# Patient Record
Sex: Female | Born: 2000 | Race: White | Hispanic: No | Marital: Single
Health system: Southern US, Community
[De-identification: ages and names within clinical notes are randomized; demographics above are authoritative.]

## PROBLEM LIST (undated history)

## (undated) DIAGNOSIS — L0390 Cellulitis, unspecified: Secondary | ICD-10-CM

## (undated) DIAGNOSIS — E663 Overweight: Secondary | ICD-10-CM

## (undated) DIAGNOSIS — Z77018 Contact with and (suspected) exposure to other hazardous metals: Secondary | ICD-10-CM

## (undated) DIAGNOSIS — L709 Acne, unspecified: Secondary | ICD-10-CM

## (undated) DIAGNOSIS — Z68.41 Body mass index (BMI) pediatric, 85th percentile to less than 95th percentile for age: Secondary | ICD-10-CM

## (undated) HISTORY — DX: Body mass index (bmi) pediatric, 85th percentile to less than 95th percentile for age: Z68.53

## (undated) HISTORY — DX: Cellulitis, unspecified: L03.90

## (undated) HISTORY — DX: Overweight: E66.3

## (undated) HISTORY — DX: Contact with and (suspected) exposure to other hazardous metals: Z77.018

## (undated) HISTORY — DX: Acne, unspecified: L70.9

---
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
## 2018-11-25 ENCOUNTER — Emergency Department (HOSPITAL_COMMUNITY)
Admission: EM | Admit: 2018-11-25 | Discharge: 2018-11-26 | Disposition: A | Discharge status: Home / Self Care | Payer: Self-pay | Attending: Emergency Medicine | Admitting: Emergency Medicine

## 2018-11-25 ENCOUNTER — Encounter: Payer: Self-pay | Admitting: Emergency Medicine

## 2018-11-25 DIAGNOSIS — Z5321 Procedure and treatment not carried out due to patient leaving prior to being seen by health care provider: Secondary | ICD-10-CM | POA: Insufficient documentation

## 2018-11-25 LAB — CBC
HCT: 41.6 % (ref 36.0–46.0)
Hemoglobin: 14.3 g/dL (ref 12.0–15.0)
MCH: 32.1 pg (ref 26.0–34.0)
MCHC: 34.4 g/dL (ref 30.0–36.0)
MCV: 93.5 fL (ref 80.0–100.0)
PLATELETS: 321 10*3/uL (ref 150–400)
RBC: 4.45 MIL/uL (ref 3.87–5.11)
RDW: 11.4 % — ABNORMAL LOW (ref 11.5–15.5)
WBC: 9 10*3/uL (ref 4.0–10.5)
nRBC: 0 % (ref 0.0–0.2)

## 2018-11-25 NOTE — ED Triage Notes (Signed)
Pt reports vaginal bleeding that started yesterday. Started out as light bleeding and now it is heavy, states she passed 2 large clots. Pt states she is pregnant but does not know how far along. Abd cramping

## 2018-11-26 ENCOUNTER — Ambulatory Visit (HOSPITAL_COMMUNITY)
Admission: EM | Admit: 2018-11-26 | Discharge: 2018-11-26 | Disposition: A | Discharge status: Home / Self Care | Payer: Self-pay | Attending: Internal Medicine | Admitting: Internal Medicine

## 2018-11-26 ENCOUNTER — Encounter (HOSPITAL_COMMUNITY): Payer: Self-pay | Admitting: Emergency Medicine

## 2018-11-26 DIAGNOSIS — O039 Complete or unspecified spontaneous abortion without complication: Secondary | ICD-10-CM | POA: Insufficient documentation

## 2018-11-26 LAB — HCG, QUANTITATIVE, PREGNANCY: HCG, BETA CHAIN, QUANT, S: 24 m[IU]/mL — AB (ref <5)

## 2018-11-26 LAB — COMPREHENSIVE METABOLIC PANEL
ALT: 14 U/L (ref 0–44)
ANION GAP: 9 (ref 5–15)
AST: 18 U/L (ref 15–41)
Albumin: 3.8 g/dL (ref 3.5–5.0)
Alkaline Phosphatase: 50 U/L (ref 38–126)
BUN: 7 mg/dL (ref 6–20)
CO2: 24 mmol/L (ref 22–32)
Calcium: 8.8 mg/dL — ABNORMAL LOW (ref 8.9–10.3)
Chloride: 108 mmol/L (ref 98–111)
Creatinine, Ser: 0.69 mg/dL (ref 0.44–1.00)
Glucose, Bld: 107 mg/dL — ABNORMAL HIGH (ref 70–99)
POTASSIUM: 3.5 mmol/L (ref 3.5–5.1)
SODIUM: 141 mmol/L (ref 135–145)
Total Bilirubin: 0.4 mg/dL (ref 0.3–1.2)
Total Protein: 6.6 g/dL (ref 6.5–8.1)

## 2018-11-26 LAB — LIPASE, BLOOD: LIPASE: 23 U/L (ref 11–51)

## 2018-11-26 NOTE — ED Provider Notes (Signed)
[facility]    CSN: [id number] Arrival date & time: 11/26/18  [id number]     History   Chief Complaint No chief complaint on file.   HPI Katelyn Rodriguez is a 18 y.o. female.   18 yo female with no chronic medical problems presents to urgent care complaining of vaginal bleeding x3 days. The patient reports that she took "probably 5" pregnancy tests 2 days ago that were positive.  Her LMP began 10/13/18.  She reports that her periods last 4-10 days.  Seen in the ED last night for the same but left AMA before discussing results from bloodwork. Admits to some cramping pain associated with bleeding.      Past Medical History:  Diagnosis Date  . Acne 2012  . Cellulitis 2001   left foot cellulitis, admitted  . Exposure to mercury 2003   got mercury in foot from broken thermometer  . Overweight peds (BMI 85-94.9 percentile) 2012    Patient Active Problem List   Diagnosis Date Noted  . 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 Idiopathic scoliosis 08/25/2013  . Acne 08/25/2013    Past Surgical History:  Procedure Laterality Date  . OPEN REDUCTION INTERNAL FIXATION (ORIF) DISTAL PHALANX Right 03/2007   2nd IP joint toes    OB History    Gravida  1   Para      Term      Preterm      AB      Living        SAB      TAB      Ectopic      Multiple      Live Births               Home Medications    Prior to Admission medications   Medication Sig Start Date End Date Taking? Authorizing Provider  cetirizine (ZYRTEC ALLERGY) 10 MG tablet Take 1 tablet (10 mg total) by mouth daily. 02/01/15   Presson, Mathis FareJennifer Lee H, PA  ibuprofen (ADVIL,MOTRIN) 600 MG tablet Take 1 tablet (600 mg total) by mouth every 6 (six) hours as needed. Patient not taking: Reported on 08/24/2015 05/08/15   Elpidio AnisUpstill, Shari, PA-C    Family History Family History  Problem Relation Age of Onset  . ADD / ADHD Sister     Social History Social History    Tobacco Use  . Smoking status: Passive Smoke Exposure - Never Smoker  . Smokeless tobacco: Never Used  . Tobacco comment: Mom and dad smoke both inside and outside. mom one pack a day  Substance Use Topics  . Alcohol use: No  . Drug use: No     Allergies   Patient has no known allergies.   Review of Systems Review of Systems  Constitutional: Negative for chills and fever.  HENT: Negative for sore throat and tinnitus.   Eyes: Negative for redness.  Respiratory: Negative for cough and shortness of breath.   Cardiovascular: Negative for chest pain and palpitations.  Gastrointestinal: Negative for abdominal pain, diarrhea, nausea and vomiting.  Genitourinary: Negative for dysuria, frequency and urgency.  Musculoskeletal: Negative for myalgias.  Skin: Negative for rash.       No lesions  Neurological: Negative for weakness.  Hematological: Does not bruise/bleed easily.  Psychiatric/Behavioral: Negative for suicidal ideas.     Physical Exam Triage Vital Signs ED Triage Vitals [11/26/18 1109]  Enc Vitals Group     BP 113/85  Pulse Rate 80     Resp 16     Temp 98.3 F (36.8 C)     Temp Source Oral     SpO2 100 %     Weight      Height      Head Circumference      Peak Flow      Pain Score 0     Pain Loc      Pain Edu?      Excl. in GC?    No data found.  Updated Vital Signs BP 113/85 (BP Location: Right Arm)   Pulse 80   Temp 98.3 F (36.8 C) (Oral)   Resp 16   LMP 11/26/2018   SpO2 100%   Visual Acuity Right Eye Distance:   Left Eye Distance:   Bilateral Distance:    Right Eye Near:   Left Eye Near:    Bilateral Near:     Physical Exam Vitals signs and nursing note reviewed.  Constitutional:      General: She is not in acute distress.    Appearance: She is well-developed.  HENT:     Head: Normocephalic and atraumatic.  Eyes:     General: No scleral icterus.    Conjunctiva/sclera: Conjunctivae normal.     Pupils: Pupils are equal,  round, and reactive to light.  Neck:     Musculoskeletal: Normal range of motion and neck supple.     Thyroid: No thyromegaly.     Vascular: No JVD.     Trachea: No tracheal deviation.  Cardiovascular:     Rate and Rhythm: Normal rate and regular rhythm.     Heart sounds: Normal heart sounds. No murmur. No friction rub. No gallop.   Pulmonary:     Effort: Pulmonary effort is normal.     Breath sounds: Normal breath sounds.  Abdominal:     General: Bowel sounds are normal. There is no distension.     Palpations: Abdomen is soft.     Tenderness: There is no abdominal tenderness.  Musculoskeletal: Normal range of motion.  Lymphadenopathy:     Cervical: No cervical adenopathy.  Skin:    General: Skin is warm and dry.  Neurological:     Mental Status: She is alert and oriented to person, place, and time.     Cranial Nerves: No cranial nerve deficit.  Psychiatric:        Behavior: Behavior normal.        Thought Content: Thought content normal.        Judgment: Judgment normal.      UC Treatments / Results  Labs (all labs ordered are listed, but only abnormal results are displayed) Labs Reviewed - No data to display  EKG None  Radiology No results found.  Procedures Procedures (including critical care time)  Medications Ordered in UC Medications - No data to display  Initial Impression / Assessment and Plan / UC Course  I have reviewed the triage vital signs and the nursing notes.  Pertinent labs & imaging results that were available during my care of the patient were reviewed by me and considered in my medical decision making (see chart for details).     hCG 24 in ED last night low-normal for approx [redacted] week gestation (assuming end of cycle 11/27 with likely ovulation 12/10-12/12). Likey miscarriage given clots of blood.  Advised repeating urine pregnancy in 5-7 days.  If still positive needs follow up for vaginal ultrasound.    Final  Clinical Impressions(s) / UC  Diagnoses   Final diagnoses:  Miscarriage   Discharge Instructions   None    ED Prescriptions    None     Controlled Substance Prescriptions [place] Controlled Substance Registry consulted? Not Applicable   Arnaldo Natal, MD 11/26/18 1135

## 2018-11-26 NOTE — ED Triage Notes (Addendum)
Pt presents to [facility] for assessment after being seen in the ED last night but leaving AMA.  States she has been having vaginal bleeding for 3 days, and yesterday she began passing large clots.  Patient states recent positive pregnancy test.  LMP prior was early November.  Pt c/o "period cramp" type pain.  Slightly positive hcg listed in chart for yesterday.

## 2018-11-26 NOTE — ED Notes (Signed)
Pt notified registration staff at 0243 that she was leaving.

## 2021-11-17 DIAGNOSIS — N9489 Other specified conditions associated with female genital organs and menstrual cycle: Secondary | ICD-10-CM | POA: Diagnosis not present

## 2021-11-17 DIAGNOSIS — R109 Unspecified abdominal pain: Secondary | ICD-10-CM | POA: Diagnosis present

## 2021-11-17 DIAGNOSIS — Z5321 Procedure and treatment not carried out due to patient leaving prior to being seen by health care provider: Secondary | ICD-10-CM | POA: Insufficient documentation

## 2021-11-18 ENCOUNTER — Emergency Department (HOSPITAL_COMMUNITY)
Admission: EM | Admit: 2021-11-18 | Discharge: 2021-11-18 | Disposition: A | Discharge status: Home / Self Care | Attending: Student | Admitting: Student

## 2021-11-18 ENCOUNTER — Other Ambulatory Visit: Payer: Self-pay

## 2021-11-18 ENCOUNTER — Encounter: Payer: Self-pay | Admitting: Emergency Medicine

## 2021-11-18 DIAGNOSIS — N939 Abnormal uterine and vaginal bleeding, unspecified: Secondary | ICD-10-CM | POA: Insufficient documentation

## 2021-11-18 DIAGNOSIS — N9489 Other specified conditions associated with female genital organs and menstrual cycle: Secondary | ICD-10-CM | POA: Insufficient documentation

## 2021-11-18 DIAGNOSIS — R1031 Right lower quadrant pain: Secondary | ICD-10-CM | POA: Insufficient documentation

## 2021-11-18 DIAGNOSIS — Z2913 Encounter for prophylactic Rho(D) immune globulin: Secondary | ICD-10-CM | POA: Diagnosis not present

## 2021-11-18 DIAGNOSIS — Z7722 Contact with and (suspected) exposure to environmental tobacco smoke (acute) (chronic): Secondary | ICD-10-CM | POA: Diagnosis not present

## 2021-11-18 LAB — COMPREHENSIVE METABOLIC PANEL
ALT: 19 U/L (ref 0–44)
AST: 17 U/L (ref 15–41)
Albumin: 3.6 g/dL (ref 3.5–5.0)
Alkaline Phosphatase: 56 U/L (ref 38–126)
Anion gap: 8 (ref 5–15)
BUN: 8 mg/dL (ref 6–20)
CO2: 24 mmol/L (ref 22–32)
Calcium: 9.2 mg/dL (ref 8.9–10.3)
Chloride: 104 mmol/L (ref 98–111)
Creatinine, Ser: 0.75 mg/dL (ref 0.44–1.00)
GFR, Estimated: >60 mL/min (ref >60)
Glucose, Bld: 115 mg/dL — ABNORMAL HIGH (ref 70–99)
Potassium: 3.7 mmol/L (ref 3.5–5.1)
Sodium: 136 mmol/L (ref 135–145)
Total Bilirubin: 0.4 mg/dL (ref 0.3–1.2)
Total Protein: 6.4 g/dL — ABNORMAL LOW (ref 6.5–8.1)

## 2021-11-18 LAB — CBC
HCT: 42.2 % (ref 36.0–46.0)
Hemoglobin: 14.3 g/dL (ref 12.0–15.0)
MCH: 31.9 pg (ref 26.0–34.0)
MCHC: 33.9 g/dL (ref 30.0–36.0)
MCV: 94.2 fL (ref 80.0–100.0)
Platelets: 320 10*3/uL (ref 150–400)
RBC: 4.48 MIL/uL (ref 3.87–5.11)
RDW: 11.9 % (ref 11.5–15.5)
WBC: 14.2 10*3/uL — ABNORMAL HIGH (ref 4.0–10.5)
nRBC: 0 % (ref 0.0–0.2)

## 2021-11-18 LAB — I-STAT BETA HCG BLOOD, ED (MC, WL, AP ONLY): I-stat hCG, quantitative: 595.4 m[IU]/mL — ABNORMAL HIGH (ref <5)

## 2021-11-18 LAB — LIPASE, BLOOD: Lipase: 24 U/L (ref 11–51)

## 2021-11-18 NOTE — ED Triage Notes (Signed)
Pt c/o RLQ with nausea x2 days. Pt denies V/D.

## 2021-11-18 NOTE — ED Triage Notes (Signed)
Pt c/o right side flank pain that started earlier today.

## 2021-11-18 NOTE — ED Notes (Signed)
Pt upset with wait time, stating she was leaving AMA

## 2021-11-19 ENCOUNTER — Emergency Department
Admission: EM | Admit: 2021-11-19 | Discharge: 2021-11-19 | Disposition: A | Discharge status: Home / Self Care | Attending: Student in an Organized Health Care Education/Training Program | Admitting: Student in an Organized Health Care Education/Training Program

## 2021-11-19 ENCOUNTER — Emergency Department

## 2021-11-19 DIAGNOSIS — R1031 Right lower quadrant pain: Secondary | ICD-10-CM

## 2021-11-19 DIAGNOSIS — O009 Unspecified ectopic pregnancy without intrauterine pregnancy: Secondary | ICD-10-CM

## 2021-11-19 DIAGNOSIS — O00101 Right tubal pregnancy without intrauterine pregnancy: Secondary | ICD-10-CM

## 2021-11-19 LAB — COMPREHENSIVE METABOLIC PANEL
ALT: 24 U/L (ref 0–44)
AST: 20 U/L (ref 15–41)
Albumin: 4.3 g/dL (ref 3.5–5.0)
Alkaline Phosphatase: 59 U/L (ref 38–126)
Anion gap: 6 (ref 5–15)
BUN: 11 mg/dL (ref 6–20)
CO2: 25 mmol/L (ref 22–32)
Calcium: 9.2 mg/dL (ref 8.9–10.3)
Chloride: 104 mmol/L (ref 98–111)
Creatinine, Ser: 0.71 mg/dL (ref 0.44–1.00)
GFR, Estimated: >60 mL/min (ref >60)
Glucose, Bld: 118 mg/dL — ABNORMAL HIGH (ref 70–99)
Potassium: 3.5 mmol/L (ref 3.5–5.1)
Sodium: 135 mmol/L (ref 135–145)
Total Bilirubin: 0.5 mg/dL (ref 0.3–1.2)
Total Protein: 7.3 g/dL (ref 6.5–8.1)

## 2021-11-19 LAB — CBC
HCT: 40.5 % (ref 36.0–46.0)
Hemoglobin: 14.1 g/dL (ref 12.0–15.0)
MCH: 32 pg (ref 26.0–34.0)
MCHC: 34.8 g/dL (ref 30.0–36.0)
MCV: 92 fL (ref 80.0–100.0)
Platelets: 322 10*3/uL (ref 150–400)
RBC: 4.4 MIL/uL (ref 3.87–5.11)
RDW: 12 % (ref 11.5–15.5)
WBC: 15.9 10*3/uL — ABNORMAL HIGH (ref 4.0–10.5)
nRBC: 0 % (ref 0.0–0.2)

## 2021-11-19 LAB — ANTIBODY SCREEN: Antibody Screen: NEGATIVE

## 2021-11-19 LAB — URINALYSIS, ROUTINE W REFLEX MICROSCOPIC
Bacteria, UA: NONE SEEN
Bilirubin Urine: NEGATIVE
Glucose, UA: NEGATIVE mg/dL
Ketones, ur: NEGATIVE mg/dL
Leukocytes,Ua: NEGATIVE
Nitrite: NEGATIVE
Protein, ur: NEGATIVE mg/dL
Specific Gravity, Urine: 1.013 (ref 1.005–1.030)
pH: 6 (ref 5.0–8.0)

## 2021-11-19 LAB — POC URINE PREG, ED: Preg Test, Ur: POSITIVE — AB

## 2021-11-19 LAB — HCG, QUANTITATIVE, PREGNANCY: hCG, Beta Chain, Quant, S: 925 m[IU]/mL — ABNORMAL HIGH (ref <5)

## 2021-11-19 LAB — LIPASE, BLOOD: Lipase: 26 U/L (ref 11–51)

## 2021-11-19 LAB — ABO/RH: ABO/RH(D): A NEG

## 2021-11-19 MED ORDER — HYDROCODONE-ACETAMINOPHEN 5-325 MG PO TABS: 1.0000 | ORAL_TABLET | ORAL | 0 refills | Status: AC | PRN

## 2021-11-19 MED ORDER — METHOTREXATE FOR ECTOPIC PREGNANCY
50.0000 mg/m2 | Freq: Once | INTRAMUSCULAR | Status: AC
  Administered 2021-11-19: 14:00:00 90 mg via INTRAMUSCULAR
  Filled 2021-11-19: qty 3.6

## 2021-11-19 MED ORDER — RHO D IMMUNE GLOBULIN 1500 UNIT/2ML IJ SOSY
300.0000 ug | PREFILLED_SYRINGE | Freq: Once | INTRAMUSCULAR | Status: AC
  Administered 2021-11-19: 12:00:00 300 ug via INTRAMUSCULAR
  Filled 2021-11-19: qty 2

## 2021-11-19 NOTE — ED Notes (Signed)
Pt given CD copy of ultrasound.

## 2021-11-19 NOTE — Discharge Instructions (Addendum)
You will need to follow up on day 4 (Saturday) and day 7 (Tuesday) for epeat Beta HCG level.  You HCG level today is 925.

## 2021-11-19 NOTE — Consult Note (Addendum)
Consult Note  Requesting Attending Physician :  Willy Eddy, MD Service Requesting Consult : ED  Assessment/Recommendations:  Katelyn Rodriguez is a 20 y.o. female seen in consultation at the request of Willy Eddy, MD regarding right lower quadrant pain, positive pregnancy test and ultrasound revealing the possibility of ectopic pregnancy.   Likely right-sided small ectopic pregnancy.  Plan:  (Patient not desirous of any pregnancy -and request immediate treatment for ectopic if present)  1.  Methotrexate as ordered  2.  Follow-up quantitative beta-hCG levels on day 4 and day 7.  (Patient traveling back to [place] and the importance of quantitative beta-hCG follow-ups discussed in detail -patient verbalized understanding)    HPI : Patient is a gravida 4 para zero 20 year old woman who began having vaginal bleeding and right lower quadrant pain approximately 2 days ago.  She has been having unprotected intercourse.  She does report a remote history of chlamydia which she says was treated. She presented to the emergency department for evaluation.  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 Authorizing Provider  HYDROcodone-acetaminophen (NORCO) 5-325 MG tablet Take 1 tablet by mouth every 4 (four)  hours as needed for moderate pain. 11/19/21   Yes Willy Eddy, MD  cetirizine (ZYRTEC ALLERGY) 10 MG tablet Take 1 tablet (10 mg total) by mouth daily. 02/01/15     Presson, Mathis Fare, PA  ibuprofen (ADVIL,MOTRIN) 600 MG tablet Take 1 tablet (600 mg total) by mouth every 6 (six) hours as needed. Patient not taking: Reported on 08/24/2015 05/08/15     Elpidio Anis, PA-C      Allergies Patient has no known allergies.      Social History Social History         Tobacco Use   Smoking status: Passive Smoke Exposure - Never Smoker   Smokeless tobacco: Never   Tobacco comments:      Mom and dad smoke both inside and outside. mom one pack a day  Substance Use Topics   Alcohol use: No   Drug use: No     Obstetric History:  OB History  Gravida Para Term Preterm AB Living  1            SAB IAB Ectopic Multiple Live Births               # Outcome Date GA Lbr Len/2nd Weight Sex Delivery Anes PTL Lv  1 Gravida             GYN History:  Hx of STIs: Yes -history of chlamydia treated.  Social  History: Social History   Socioeconomic History   Marital status: Single    Spouse name: Not on file   Number of children: Not on file   Years of education: Not on file   Highest education level: Not on file  Occupational History   Not on file  Tobacco Use   Smoking status: Passive Smoke Exposure - Never Smoker   Smokeless tobacco: Never   Tobacco comments:    Mom and dad smoke both inside and outside. mom one pack a day  Substance and Sexual Activity   Alcohol use: No   Drug use: No   Sexual activity: Yes    Birth control/protection: None  Other Topics Concern   Not on file  Social History Narrative   Lives with mom and Dad and 2 younger sister, 9(Kirsten) and 4 year younger Winslow. Exposed to tobacco smoke. 2012:Academically gifted classes in school   Social Determinants of Health   Financial Resource Strain: Not on file  Food Insecurity: Not on file  Transportation  Needs: Not on file  Physical Activity: Not on file  Stress: Not on file  Social Connections: Not on file     Objective :  Vital signs in last 24 hours: Temp:  [98.6 F (37 C)] 98.6 F (37 C) (12/26 2357) Pulse Rate:  [94-103] 103 (12/27 1150) Resp:  [16-20] 17 (12/27 1150) BP: (100-120)/(63-87) 112/63 (12/27 1150) SpO2:  [95 %-100 %] 98 % (12/27 1150) Weight:  [74.8 kg] 74.8 kg (12/27 0836)  Intake/Output last 3 shifts: No intake/output data recorded.   Physical Exam: Abdominal exam:  Abdomen is soft nontender there is no rebound or significant guarding present.  Test Results  Imaging:   FINDINGS: Intrauterine gestational sac: None  Yolk sac: Not Visualized.  Embryo: Not Visualized.  Cardiac Activity: Not Visualized.  Heart Rate: Not applicable  Subchorionic hemorrhage: None visualized.  Maternal uterus/adnexae: There is a complex 2.2 x 1.5 x 1.8 cm cystic lesion within the right ovary with irregular echogenicities and peripheral rim of Doppler flow, likely an evolving/hemorrhagic corpus luteum. There is also a complex mass which appears to be abutting the right ovary measuring 2.8 x 1.8 x 1.6 cm with peripheral rim of Doppler flow. Small to moderate amount of free fluid in the posterior cul-de-sac.  IMPRESSION: 1. No intrauterine gestational sac identified. Complex mass in the right adnexa which appears to be abutting the right ovary and is suspicious for ectopic pregnancy. Small to moderate amount of free fluid. OBGYN consultation recommended. 2. Complex cyst of the right ovary likely evolving/hemorrhagic corpus luteum.   I spent 43 minutes involved in the care of this patient preparing to see the patient by obtaining and reviewing her medical history (including labs, imaging tests and prior procedures), documenting clinical information in the electronic health record (EHR), counseling and coordinating care plans, writing and sending prescriptions,  ordering tests or procedures and in direct communicating with the patient and medical staff discussing pertinent items from her history and physical exam.  Elonda Husky, M.D. 11/19/2021 1:52 PM

## 2021-11-19 NOTE — ED Notes (Signed)
Pt verbalized understanding discharge instructions, follow-up care and prescriptions review. Pt denies pain at this time.   RN inadvertently got pt to sign in another pt chart.

## 2021-11-19 NOTE — ED Notes (Signed)
Informed pt of waiting  verbalized understanding

## 2021-11-19 NOTE — ED Notes (Signed)
Pt taken to US

## 2021-11-19 NOTE — ED Provider Notes (Signed)
[facility] Emergency Department Provider Note    Event Date/Time   First MD Initiated Contact with Patient 11/19/21 0920     (approximate)  I have reviewed the triage vital signs and the nursing notes.   HISTORY  Chief Complaint Abdominal Pain    HPI Katelyn Rodriguez is a 20 y.o. female presents to the ER with complaint of 2 days of rlq pain vaginal bleeding.  Lmp 20th of last month.  G4p0.  No history of ectopic.  No fevers.  States she is RH neg.  Went to [facility] for same complaint but left 2/2 long wait times.      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 Authorizing Provider  HYDROcodone-acetaminophen (NORCO) 5-325 MG tablet Take 1 tablet by mouth every 4 (four) hours as needed for moderate pain. 11/19/21  Yes Willy Eddy, MD  cetirizine (ZYRTEC ALLERGY) 10 MG tablet Take 1 tablet (10 mg total) by mouth daily. 02/01/15   Presson, Mathis Fare, PA  ibuprofen (ADVIL,MOTRIN) 600 MG tablet Take 1 tablet (600 mg total) by mouth every 6 (six) hours as needed. Patient not taking: Reported on 08/24/2015 05/08/15   Elpidio Anis, PA-C    Allergies Patient has no known allergies.    Social History Social History   Tobacco Use   Smoking status: Passive Smoke Exposure - Never Smoker    Smokeless tobacco: Never   Tobacco comments:    Mom and dad smoke both inside and outside. mom one pack a day  Substance Use Topics   Alcohol use: No   Drug use: No    Review of Systems Patient denies headaches, rhinorrhea, blurry vision, numbness, shortness of breath, chest pain, edema, cough, abdominal pain, nausea, vomiting, diarrhea, dysuria, fevers, rashes or hallucinations unless otherwise stated above in HPI. ____________________________________________   PHYSICAL EXAM:  VITAL SIGNS: Vitals:   11/19/21 1030 11/19/21 1150  BP: 120/71 112/63  Pulse: 99 (!) 103  Resp: 18 17  Temp:    SpO2: 95% 98%    Constitutional: Alert and oriented.  Eyes: Conjunctivae are normal.  Head: Atraumatic. Nose: No congestion/rhinnorhea. Mouth/Throat: Mucous membranes are moist.   Neck: No stridor. Painless ROM.  Cardiovascular: Normal rate, regular rhythm. Grossly normal heart sounds.  Good peripheral circulation. Respiratory: Normal respiratory effort.  No retractions. Lungs CTAB. Gastrointestinal: Soft , mild ttp in rlq, no guarding or rebound. No distention. No abdominal bruits. No CVA tenderness. Genitourinary:  Musculoskeletal: No lower extremity tenderness nor edema.  No joint effusions. Neurologic:  Normal speech and language. No gross focal neurologic deficits are appreciated. No facial droop Skin:  Skin is warm, dry and intact. No rash noted. Psychiatric: Mood and affect are normal. Speech and behavior are normal.  ____________________________________________  LABS (all labs ordered are listed, but only abnormal results are displayed)  Results for orders placed or performed during the hospital encounter of 11/19/21 (from the past 24 hour(s))  Lipase, blood     Status: None   Collection Time: 11/18/21 11:59 PM  Result Value Ref Range   Lipase 26 11 - 51 U/L  Comprehensive metabolic panel     Status: Abnormal   Collection Time: 11/18/21 11:59 PM  Result Value Ref Range    Sodium 135 135 - 145 mmol/L   Potassium 3.5 3.5 - 5.1 mmol/L   Chloride 104 98 - 111 mmol/L   CO2 25 22 - 32 mmol/L   Glucose, Bld 118 (H) 70 - 99 mg/dL   BUN 11 6 - 20 mg/dL   Creatinine, Ser 4.69 0.44 - 1.00 mg/dL   Calcium 9.2 8.9 - 62.9 mg/dL   Total Protein 7.3 6.5 - 8.1 g/dL   Albumin 4.3 3.5 - 5.0 g/dL   AST 20 15 - 41 U/L   ALT 24 0 - 44 U/L   Alkaline Phosphatase 59 38 - 126 U/L   Total Bilirubin 0.5 0.3 - 1.2 mg/dL   GFR, Estimated >52 >84 mL/min   Anion gap 6 5 - 15  CBC     Status: Abnormal   Collection Time: 11/18/21 11:59 PM  Result Value Ref Range   WBC 15.9 (H) 4.0 - 10.5 K/uL   RBC 4.40 3.87 - 5.11 MIL/uL   Hemoglobin 14.1 12.0 - 15.0 g/dL   HCT 13.2 44.0 - 10.2 %   MCV 92.0 80.0 - 100.0 fL   MCH 32.0 26.0 - 34.0 pg   MCHC 34.8 30.0 - 36.0 g/dL   RDW 72.5 36.6 - 44.0 %   Platelets 322 150 - 400 K/uL   nRBC 0.0 0.0 - 0.2 %  hCG, quantitative, pregnancy     Status: Abnormal   Collection Time: 11/18/21 11:59 PM  Result Value Ref Range   hCG, Beta Chain, Quant, S 925 (H) <5 mIU/mL  ABO/Rh     Status: None   Collection Time: 11/18/21 11:59 PM  Result Value Ref Range   ABO/RH(D)      A NEG Performed at [facility], [address] [place], [place] [address]   Antibody screen     Status: None   Collection Time: 11/19/21  5:00 AM  Result Value Ref Range   Antibody Screen      NEG Performed at [facility], [address] [place], [place] [address]   Rhogam injection     Status: None (Preliminary result)   Collection Time: 11/19/21  5:00 AM  Result Value Ref Range   Unit Number [id number]    Blood Component Type RHIG    Unit division 00    Status of Unit ISSUED    Transfusion Status      OK TO TRANSFUSE Performed at [facility], [address] [place], [place] [address]   POC urine preg, ED     Status: Abnormal   Collection Time: 11/19/21 10:28 AM  Result Value Ref Range   Preg Test, Ur POSITIVE (A) NEGATIVE    ____________________________________________ ____________________________________________  RADIOLOGY  I personally reviewed all radiographic images ordered to evaluate for the above acute complaints and reviewed radiology reports and findings.  These findings were personally discussed with the patient.  Please see medical record for radiology report.  ____________________________________________   PROCEDURES  Procedure(s) performed:  Procedures  Critical Care performed: no ____________________________________________   INITIAL IMPRESSION / ASSESSMENT AND PLAN / ED COURSE  Pertinent labs & imaging results that were available during my care of the patient were reviewed by me and considered in my medical decision making (see chart for details).   DDX: ectopic, miscarraige, aub, dub, cyst, appendicitis  Katelyn Rodriguez is a 20 y.o. who presents to the ED with symptoms as described above concerning for ectopic.  Her abdominal exam is without guarding or rebound tenderness.  She is not tachycardic no hemodynamic instability.  Not any blood thinners.  She is Rh-.  Ultrasound ordered.  Clinical Course as of 11/19/21 1329  Tue Nov 19, 2021  1033 Patient ultrasound with findings concerning for ectopic pregnancy.  I discussed this case in consultation with Dr. Logan Bores of OB/GYN.  She is Rh- therefore will order RhoGAM.  She otherwise hemodynamically stable. [PR]  1326 Patient and seen and evaluated by Dr. Logan Bores at bedside.  Patient requesting discharge home.  Will be given methotrexate.  States that she is going back to [place].  Discussed the importance of follow-up 1 day for for repeat hCG.  Will be sent with a copy of her images and blood work. [PR]    Clinical Course User Index [PR] Willy Eddy, MD    The patient was evaluated in Emergency Department today for the symptoms described in the history of present illness. He/she was evaluated in the context of the global COVID-19  pandemic, which necessitated consideration that the patient might be at risk for infection with the SARS-CoV-2 virus that causes COVID-19. Institutional protocols and algorithms that pertain to the evaluation of patients at risk for COVID-19 are in a state of rapid change based on information released by regulatory bodies including the CDC and federal and state organizations. These policies and algorithms were followed during the patient's care in the ED.  As part of my medical decision making, I reviewed the following data within the electronic MEDICAL RECORD NUMBER Nursing notes reviewed and incorporated, Labs reviewed, notes from prior ED visits and [place] Controlled Substance Database   ____________________________________________   FINAL CLINICAL IMPRESSION(S) / ED DIAGNOSES  Final diagnoses:  Ectopic pregnancy without intrauterine pregnancy, unspecified location      NEW MEDICATIONS STARTED DURING THIS VISIT:  New Prescriptions   HYDROCODONE-ACETAMINOPHEN (NORCO) 5-325 MG TABLET    Take 1 tablet by mouth every 4 (four) hours as needed for moderate pain.     Note:  This document was prepared using Dragon voice recognition software and may include unintentional dictation errors.    Willy Eddy, MD 11/19/21 1329

## 2021-11-19 NOTE — ED Notes (Signed)
This RN is attempting to find another RN or a staff member who can administer methotrexate within their scope of practice.

## 2021-11-20 LAB — RHOGAM INJECTION: Unit division: 0
